# Patient Record
Sex: Male | Born: 1975
Health system: Southern US, Community
[De-identification: ages and names within clinical notes are randomized; demographics above are authoritative.]

## PROBLEM LIST (undated history)

## (undated) DIAGNOSIS — J45909 Unspecified asthma, uncomplicated: Secondary | ICD-10-CM

## (undated) DIAGNOSIS — M5126 Other intervertebral disc displacement, lumbar region: Secondary | ICD-10-CM

## (undated) HISTORY — PX: TONSILLECTOMY: SUR1361

## (undated) HISTORY — PX: NASAL SINUS SURGERY: SHX719

---

## 2015-12-22 ENCOUNTER — Ambulatory Visit
Admission: RE | Admit: 2015-12-22 | Discharge: 2015-12-22 | Disposition: A | Discharge status: Home / Self Care | Payer: BLUE CROSS/BLUE SHIELD | Source: Ambulatory Visit | Attending: Family Medicine | Admitting: Family Medicine

## 2015-12-22 ENCOUNTER — Other Ambulatory Visit: Payer: Self-pay | Admitting: Family Medicine

## 2015-12-22 DIAGNOSIS — R0789 Other chest pain: Secondary | ICD-10-CM

## 2015-12-22 MED ORDER — IOPAMIDOL (ISOVUE-370) INJECTION 76%
100.0000 mL | Freq: Once | INTRAVENOUS | Status: AC | PRN
  Administered 2015-12-22: 100 mL via INTRAVENOUS

## 2016-06-11 DIAGNOSIS — R509 Fever, unspecified: Secondary | ICD-10-CM | POA: Diagnosis not present

## 2016-06-28 DIAGNOSIS — H40023 Open angle with borderline findings, high risk, bilateral: Secondary | ICD-10-CM | POA: Diagnosis not present

## 2016-07-08 DIAGNOSIS — H401131 Primary open-angle glaucoma, bilateral, mild stage: Secondary | ICD-10-CM | POA: Diagnosis not present

## 2016-08-28 DIAGNOSIS — H401131 Primary open-angle glaucoma, bilateral, mild stage: Secondary | ICD-10-CM | POA: Diagnosis not present

## 2016-09-01 DIAGNOSIS — Z23 Encounter for immunization: Secondary | ICD-10-CM | POA: Diagnosis not present

## 2017-02-24 DIAGNOSIS — H401131 Primary open-angle glaucoma, bilateral, mild stage: Secondary | ICD-10-CM | POA: Diagnosis not present

## 2017-07-14 DIAGNOSIS — Z23 Encounter for immunization: Secondary | ICD-10-CM | POA: Diagnosis not present

## 2017-07-14 DIAGNOSIS — Z029 Encounter for administrative examinations, unspecified: Secondary | ICD-10-CM | POA: Diagnosis not present

## 2017-07-14 DIAGNOSIS — H401190 Primary open-angle glaucoma, unspecified eye, stage unspecified: Secondary | ICD-10-CM | POA: Diagnosis not present

## 2017-09-10 DIAGNOSIS — H401131 Primary open-angle glaucoma, bilateral, mild stage: Secondary | ICD-10-CM | POA: Diagnosis not present

## 2018-03-11 DIAGNOSIS — H401131 Primary open-angle glaucoma, bilateral, mild stage: Secondary | ICD-10-CM | POA: Diagnosis not present

## 2018-09-16 DIAGNOSIS — H401131 Primary open-angle glaucoma, bilateral, mild stage: Secondary | ICD-10-CM | POA: Diagnosis not present

## 2020-05-11 DIAGNOSIS — H401131 Primary open-angle glaucoma, bilateral, mild stage: Secondary | ICD-10-CM | POA: Diagnosis not present

## 2020-07-31 DIAGNOSIS — Z1159 Encounter for screening for other viral diseases: Secondary | ICD-10-CM | POA: Diagnosis not present

## 2020-07-31 DIAGNOSIS — Z0000 Encounter for general adult medical examination without abnormal findings: Secondary | ICD-10-CM | POA: Diagnosis not present

## 2020-07-31 DIAGNOSIS — R208 Other disturbances of skin sensation: Secondary | ICD-10-CM | POA: Diagnosis not present

## 2020-07-31 DIAGNOSIS — Z13220 Encounter for screening for lipoid disorders: Secondary | ICD-10-CM | POA: Diagnosis not present

## 2020-07-31 DIAGNOSIS — Z125 Encounter for screening for malignant neoplasm of prostate: Secondary | ICD-10-CM | POA: Diagnosis not present

## 2020-07-31 DIAGNOSIS — R519 Headache, unspecified: Secondary | ICD-10-CM | POA: Diagnosis not present

## 2020-08-03 DIAGNOSIS — Z1211 Encounter for screening for malignant neoplasm of colon: Secondary | ICD-10-CM | POA: Diagnosis not present

## 2020-08-31 DIAGNOSIS — R0681 Apnea, not elsewhere classified: Secondary | ICD-10-CM | POA: Diagnosis not present

## 2020-10-12 DIAGNOSIS — F17210 Nicotine dependence, cigarettes, uncomplicated: Secondary | ICD-10-CM | POA: Diagnosis not present

## 2020-10-12 DIAGNOSIS — J343 Hypertrophy of nasal turbinates: Secondary | ICD-10-CM | POA: Diagnosis not present

## 2020-10-12 DIAGNOSIS — J342 Deviated nasal septum: Secondary | ICD-10-CM | POA: Diagnosis not present

## 2020-12-21 DIAGNOSIS — Z01818 Encounter for other preprocedural examination: Secondary | ICD-10-CM | POA: Diagnosis not present

## 2020-12-25 DIAGNOSIS — J342 Deviated nasal septum: Secondary | ICD-10-CM | POA: Diagnosis not present

## 2020-12-25 DIAGNOSIS — J343 Hypertrophy of nasal turbinates: Secondary | ICD-10-CM | POA: Diagnosis not present

## 2021-02-08 DIAGNOSIS — K219 Gastro-esophageal reflux disease without esophagitis: Secondary | ICD-10-CM | POA: Diagnosis not present

## 2021-02-08 DIAGNOSIS — M5417 Radiculopathy, lumbosacral region: Secondary | ICD-10-CM | POA: Diagnosis not present

## 2021-02-15 DIAGNOSIS — M5459 Other low back pain: Secondary | ICD-10-CM | POA: Diagnosis not present

## 2021-02-16 DIAGNOSIS — M5416 Radiculopathy, lumbar region: Secondary | ICD-10-CM | POA: Diagnosis not present

## 2021-02-19 DIAGNOSIS — M5459 Other low back pain: Secondary | ICD-10-CM | POA: Diagnosis not present

## 2021-02-22 ENCOUNTER — Emergency Department (HOSPITAL_COMMUNITY)
Admission: EM | Admit: 2021-02-22 | Discharge: 2021-02-22 | Disposition: A | Discharge status: Home / Self Care | Payer: BC Managed Care – PPO | Attending: Emergency Medicine | Admitting: Emergency Medicine

## 2021-02-22 ENCOUNTER — Encounter (HOSPITAL_COMMUNITY): Payer: Self-pay | Admitting: Emergency Medicine

## 2021-02-22 ENCOUNTER — Other Ambulatory Visit: Payer: Self-pay

## 2021-02-22 DIAGNOSIS — R6889 Other general symptoms and signs: Secondary | ICD-10-CM | POA: Diagnosis not present

## 2021-02-22 DIAGNOSIS — F17200 Nicotine dependence, unspecified, uncomplicated: Secondary | ICD-10-CM | POA: Diagnosis not present

## 2021-02-22 DIAGNOSIS — M545 Low back pain, unspecified: Secondary | ICD-10-CM | POA: Insufficient documentation

## 2021-02-22 DIAGNOSIS — T7840XA Allergy, unspecified, initial encounter: Secondary | ICD-10-CM | POA: Diagnosis not present

## 2021-02-22 DIAGNOSIS — T50905A Adverse effect of unspecified drugs, medicaments and biological substances, initial encounter: Secondary | ICD-10-CM | POA: Diagnosis not present

## 2021-02-22 DIAGNOSIS — R0602 Shortness of breath: Secondary | ICD-10-CM | POA: Insufficient documentation

## 2021-02-22 DIAGNOSIS — R220 Localized swelling, mass and lump, head: Secondary | ICD-10-CM | POA: Diagnosis not present

## 2021-02-22 DIAGNOSIS — T782XXA Anaphylactic shock, unspecified, initial encounter: Secondary | ICD-10-CM | POA: Diagnosis not present

## 2021-02-22 DIAGNOSIS — M5126 Other intervertebral disc displacement, lumbar region: Secondary | ICD-10-CM | POA: Diagnosis not present

## 2021-02-22 HISTORY — DX: Other intervertebral disc displacement, lumbar region: M51.26

## 2021-02-22 MED ORDER — FAMOTIDINE IN NACL 20-0.9 MG/50ML-% IV SOLN
20.0000 mg | Freq: Once | INTRAVENOUS | Status: AC
  Administered 2021-02-22: 20 mg via INTRAVENOUS
  Filled 2021-02-22: qty 50

## 2021-02-22 MED ORDER — EPINEPHRINE 0.3 MG/0.3ML IJ SOAJ
0.3000 mg | Freq: Once | INTRAMUSCULAR | Status: AC
  Administered 2021-02-22: 0.3 mg via INTRAMUSCULAR
  Filled 2021-02-22: qty 0.3

## 2021-02-22 MED ORDER — EPINEPHRINE 0.3 MG/0.3ML IJ SOAJ: 0.3000 mg | INTRAMUSCULAR | 0 refills | Status: AC | PRN

## 2021-02-22 MED ORDER — METHYLPREDNISOLONE SODIUM SUCC 125 MG IJ SOLR
125.0000 mg | Freq: Once | INTRAMUSCULAR | Status: AC
  Administered 2021-02-22: 125 mg via INTRAVENOUS
  Filled 2021-02-22: qty 2

## 2021-02-22 MED ORDER — SODIUM CHLORIDE 0.9 % IV BOLUS
1000.0000 mL | Freq: Once | INTRAVENOUS | Status: AC
Start: 2021-02-22 — End: 2021-02-22
  Administered 2021-02-22: 1000 mL via INTRAVENOUS

## 2021-02-22 NOTE — ED Triage Notes (Signed)
Patient presents due to throat discomfort, difficulty breathing and facial swelling. Throat discomfort started yesterday. Patient takes oxycodone for pain due to herniated disc. His MD told him to go to the hospital. EMS administer 50 mg of Benadryl. EMS noted the patient scratching.    EMS vitals: 120/90 BP 97.4 Temp 98% SPO2 16 RR

## 2021-02-22 NOTE — Discharge Instructions (Signed)
Please avoid any new medication that was recently started which may contribute to your allergic reaction.  If you develop similar reaction, please use EpiPen as prescribed and return promptly.  Follow-up with an allergist for further evaluation.

## 2021-02-22 NOTE — ED Provider Notes (Signed)
[facility]-EMERGENCY DEPT Provider Note   CSN: [id number] Arrival date & time: 02/22/21  1630     History Chief Complaint  Patient presents with  . Shortness of Breath  . Back Pain  . Oral Swelling    Alexander Buckley is a 45 y.o. male.  The history is provided by the patient and the spouse. No language interpreter was used.  Shortness of Breath Back Pain    45 year old male brought here via EMS from his doctor's office for concern of allergic reaction.  Patient report he has a herniated disc in his back and he is scheduled to receive surgical intervention.  He has been given several different medication for the past few days to help with his back 1 of which is oxycodone as well as Neurontin.  He report for the past 2 days he has been having sensation that his throat is closing and feeling a bit lightheaded.  Report having trouble sleeping felt that he was wheezing. today during his visit to his PCP he was given some additional medications and shortly after he develop a skin rash, worsening shortness of breath and shakiness along with feeling flushed.  EMS was contacted and patient was given Benadryl prior to arrival.  He did report some improvement of his symptoms after receiving Benadryl.  He denies any prior allergies in the past.  He denies any other environmental changes, new pets or new soap.  He is unsure which medication may contribute to his allergic reaction.  Past Medical History:  Diagnosis Date  . Lumbar herniated disc     There are no problems to display for this patient.   History reviewed. No pertinent surgical history.     History reviewed. No pertinent family history.  Social History   Tobacco Use  . Smoking status: Current Every Day Smoker  . Smokeless tobacco: Never Used    Home Medications Prior to Admission medications   Not on File    Allergies    Patient has no allergy information on record.  Review of Systems   Review  of Systems  Respiratory: Positive for shortness of breath.   Musculoskeletal: Positive for back pain.  All other systems reviewed and are negative.   Physical Exam Updated Vital Signs BP (!) 114/103   Pulse 97   Temp 99.3 F (37.4 C) (Oral)   Resp 20   Wt 78 kg   SpO2 99%   Physical Exam Vitals and nursing note reviewed.  Constitutional:      General: He is not in acute distress.    Appearance: He is well-developed.  HENT:     Head: Atraumatic.     Mouth/Throat:     Comments: Uvula midline no tonsillar enlargement or exudates no tongue swelling no mucosal swelling no trismus and no stridor Eyes:     Conjunctiva/sclera: Conjunctivae normal.  Cardiovascular:     Rate and Rhythm: Normal rate and regular rhythm.     Pulses: Normal pulses.     Heart sounds: Normal heart sounds.  Pulmonary:     Effort: Pulmonary effort is normal.     Breath sounds: Normal breath sounds. No wheezing, rhonchi or rales.  Abdominal:     Palpations: Abdomen is soft.     Tenderness: There is no abdominal tenderness.  Musculoskeletal:     Cervical back: Neck supple.  Skin:    Findings: No rash (Urticarial rash noted throughout face neck and upper back and chest.).  Neurological:  Mental Status: He is alert and oriented to person, place, and time.  Psychiatric:        Mood and Affect: Mood normal.     ED Results / Procedures / Treatments   Labs (all labs ordered are listed, but only abnormal results are displayed) Labs Reviewed - No data to display  EKG None  Radiology No results found.  Procedures .Critical Care Performed by: Fayrene Helper, PA-C Authorized by: Fayrene Helper, PA-C   Critical care provider statement:    Critical care time (minutes):  36   Critical care was time spent personally by me on the following activities:  Discussions with consultants, evaluation of patient's response to treatment, examination of patient, ordering and performing treatments and interventions,  ordering and review of laboratory studies, ordering and review of radiographic studies, pulse oximetry, re-evaluation of patient's condition, obtaining history from patient or surrogate and review of old charts     Medications Ordered in ED Medications  EPINEPHrine (EPI-PEN) injection 0.3 mg (0.3 mg Intramuscular Given 02/22/21 1753)  methylPREDNISolone sodium succinate (SOLU-MEDROL) 125 mg/2 mL injection 125 mg (125 mg Intravenous Given 02/22/21 1752)  famotidine (PEPCID) IVPB 20 mg premix (0 mg Intravenous Stopped 02/22/21 1827)  sodium chloride 0.9 % bolus 1,000 mL (0 mLs Intravenous Stopped 02/22/21 1904)    ED Course  I have reviewed the triage vital signs and the nursing notes.  Pertinent labs & imaging results that were available during my care of the patient were reviewed by me and considered in my medical decision making (see chart for details).    MDM Rules/Calculators/A&P                          BP 107/67   Pulse 84   Temp 99.3 F (37.4 C) (Oral)   Resp 18   Wt 78 kg   SpO2 98%   Final Clinical Impression(s) / ED Diagnoses Final diagnoses:  Anaphylaxis, initial encounter    Rx / DC Orders ED Discharge Orders         Ordered    EPINEPHrine 0.3 mg/0.3 mL IJ SOAJ injection  As needed        02/22/21 2058         5:26 PM Patient with lumbar disc hernia scheduled to have back surgery who visit his doctor for preop today.He apparently developed an anaphylactic reaction to unknown medication which could be gabapentin or oxycodone that was recently given for his back pain.  He is reporting symptom concerning for anaphylaxis which include throat swelling, difficulty breathing and having wheezing.  He also has urticarial rash on exam.  He was given Benadryl prior to arrival with some reported improvement.  Will give EpiPen, Solu-Medrol, Pepcid, and IV fluid.  Will monitor closely.  8:59 PM Patient will monitor closely for the past 4 hours without any worsening of  symptoms.  He felt much better.  At this time he is stable for discharge.  Encourage patient to avoid all of the new medications which may contribute to his symptoms.  He would benefit from close follow-up with allergist for further assessment.  Patient prescribed EpiPen at discharge.  Return precaution given.   Fayrene Helper, PA-C 02/22/21 2101    Gwyneth Sprout, MD 02/26/21 [phone]

## 2021-02-27 DIAGNOSIS — E7800 Pure hypercholesterolemia, unspecified: Secondary | ICD-10-CM | POA: Diagnosis not present

## 2021-02-27 DIAGNOSIS — Z01818 Encounter for other preprocedural examination: Secondary | ICD-10-CM | POA: Diagnosis not present

## 2021-02-28 DIAGNOSIS — M5416 Radiculopathy, lumbar region: Secondary | ICD-10-CM | POA: Diagnosis not present

## 2021-02-28 DIAGNOSIS — G4700 Insomnia, unspecified: Secondary | ICD-10-CM | POA: Diagnosis not present

## 2021-03-02 ENCOUNTER — Ambulatory Visit (INDEPENDENT_AMBULATORY_CARE_PROVIDER_SITE_OTHER): Payer: BC Managed Care – PPO | Admitting: Allergy

## 2021-03-02 ENCOUNTER — Other Ambulatory Visit: Payer: Self-pay

## 2021-03-02 ENCOUNTER — Encounter: Payer: Self-pay | Admitting: Allergy

## 2021-03-02 VITALS — BP 110/80 | HR 82 | Temp 98.3°F | Resp 18 | Ht 70.0 in | Wt 164.6 lb

## 2021-03-02 DIAGNOSIS — T50905D Adverse effect of unspecified drugs, medicaments and biological substances, subsequent encounter: Secondary | ICD-10-CM

## 2021-03-02 DIAGNOSIS — T7840XD Allergy, unspecified, subsequent encounter: Secondary | ICD-10-CM

## 2021-03-02 NOTE — Patient Instructions (Addendum)
 -   will obtain tryptase level (this looks to see if your allergy cells are "hyperreactive" and potentially prone to unprovoked reactions) - skin testing to miralax performed (this is the only medication of what you were taking we have avaiable for skin testing) is negative.  Thus does not have IgE (allergy antibody) to Miralax - skin testing to shrimp is negative.  Thus not concerned for shrimp allergy.   - for the other medications you were taking- gabapentin, oxycodone, senna,ducolax- the only way to see if you are non-allergic to these medications would be to perform in-office graded oral challenge - for medications like oxycodone (opiate based medications) a common side effect can be hives, itching, flushing.  Thus it is possible that you experienced side effects related to oxycodone use - if opiate based medication is necessary in the future you could use antihistamine regimen: Zyrtec 10mg  1 tab twice a day with Pepcid 20mg  1 tab twice a day.  This high-dose antihistamine regimen may allow you to tolerate this medication if needed in future.  If you note allergic reaction like symptoms (see action plan) with opiate use then discontinue medication - Ultram is an alternative pain relieving medication recommended for use with opiate medication is not able to be taken - would recommend you have access to epinephrine device and follow emergency action plan provided today  Can schedule for oral medication challenge in-office (can start with gabapentin -- if you still have the medication can bring to the office if it is in tablet form.  If it is a capsule let know)

## 2021-03-02 NOTE — Progress Notes (Signed)
New Patient Note  RE: Alexander Buckley MRN: [id number] DOB: 1976/09/23 Date of Office Visit: 03/02/2021  Referring provider: Christena Deem, MD Primary care provider: Ileana Ladd, MD  Chief Complaint: Allergic reaction  History of present illness: Alexander Buckley is a 45 y.o. male presenting today for consultation for anaphylaxis.  He presents today with his wife.  He has a herniated disc and needs surgery to repair this that is planned for next week.  Due to the herniated disc he also is having sciatica.  He has been recommended to take a variety of different medications to help with pain control.  Due to the pain medication it seems that he became constipated thus was also recommended to take a combination of medications to help with constipation.  He was initially taking gabapentin and oxycodone and then MiraLAX, Dulcolax and senna were added to the regimen. He states he started to feel different for a couple of days prior to having more symptoms concerning for an allergic reaction.  He states he was having some "different" breathing but denies having difficulty breathing for about 2 days prior to last Thursday.  On Thursday morning he states he had a banana and coffee which he normally does and he may have had an egg sandwich.  He went to see his PCP for preop clearance for his surgery and felt like his throat was swelling up in like his breathing was not normal and like he was wheezing and he started to feel dizzy and warm and very flushed.  His wife states he did not have hives.     His PCP called EMS and he was taken to the ED.  He was provided with Benadryl with EMS and states that did start to make him feel a bit better.   He states for 2 straight nights prior to Thursday he did have shrimp but he has not had issues with eating shrimp before.  In the ED on exam he was noted to have a "uvula midline no tonsillar enlargement or exudates no tongue swelling no mucosal swelling no trismus  and no stridor", "urticarial rash noted throughout face neck and upper back and chest".  He received IM epinephrine, IV Solu-Medrol, IV Pepcid as well as IV fluids. He was discharged with a prescription for an epinephrine device.  Symptoms did improve and he was able to be discharged home.  On Friday he took miralax to help with his constipation and after taking it felt like his throat was getting tight again.  He did not take any of the other medications on Friday just the MiraLAX.   Review of systems: Review of Systems  Constitutional: Negative for chills and fever.       Flushed.  See HPI  HENT:       See HPI  Eyes: Negative.   Respiratory: Positive for shortness of breath and wheezing. Negative for cough.   Cardiovascular: Negative.   Gastrointestinal: Positive for abdominal pain and constipation. Negative for nausea and vomiting.  Musculoskeletal: Positive for back pain.  Skin: Negative for itching.       See HPI  Neurological: Positive for dizziness. Negative for loss of consciousness.    All other systems negative unless noted above in HPI  Past medical history: Past Medical History:  Diagnosis Date  . Lumbar herniated disc     Past surgical history: History reviewed. No pertinent surgical history.  Family history:  History reviewed. No pertinent family history.  Social history:  Lives in a home with his wife.  Tobacco Use  . Smoking status: Current Every Day Smoker  . Smokeless tobacco: Never Used     Medication List: Current Outpatient Medications  Medication Sig Dispense Refill  . acetaminophen (TYLENOL) 500 MG tablet Take 500-1,000 mg by mouth every 6 (six) hours as needed (pain).    Marland Kitchen EPINEPHrine 0.3 mg/0.3 mL IJ SOAJ injection Inject 0.3 mg into the muscle as needed for anaphylaxis. 1 each 0  . latanoprost (XALATAN) 0.005 % ophthalmic solution Place 1 drop into both eyes at bedtime.    . pantoprazole (PROTONIX) 40 MG tablet Take 40 mg by mouth daily as  needed (acid reflux).    Marland Kitchen zolpidem (AMBIEN) 10 MG tablet Take 10 mg by mouth at bedtime as needed.     No current facility-administered medications for this visit.    Known medication allergies: Allergies  Allergen Reactions  . Gabapentin Swelling     Physical examination: Blood pressure 110/80, pulse 82, temperature 98.3 F (36.8 C), temperature source Temporal, resp. rate 18, height 5\' 10"  (1.778 m), weight 164 lb 9.6 oz (74.7 kg), SpO2 97 %.  General: Alert, interactive, in no acute distress. HEENT: PERRLA, TMs pearly gray, turbinates minimally edematous without discharge, post-pharynx non erythematous. Neck: Supple without lymphadenopathy. Lungs: Clear to auscultation without wheezing, rhonchi or rales. {no increased work of breathing. CV: Normal S1, S2 without murmurs. Abdomen: Nondistended, nontender. Skin: Warm and dry, without lesions or rashes. Extremities:  No clubbing, cyanosis or edema. Neuro:   Grossly intact.  Diagnositics/Labs: Allergy testing: Skin prick testing to MiraLAX at 1:100, 1:10, 1:1 concentrations were negative. Skin prick testing to strep is negative. Histamine control was positive. Allergy testing results were read and interpreted by provider, documented by clinical staff.   Assessment and plan: Adverse medication effect Allergic reaction versus side effect   - will obtain tryptase level (this looks to see if your allergy cells are "hyperreactive" and potentially prone to unprovoked reactions) - skin testing to miralax performed (this is the only medication of what you were taking we have avaiable for skin testing) is negative.  Thus does not have IgE (allergy antibody) to Miralax - skin testing to shrimp is negative.  Thus not concerned for shrimp allergy.   - for the other medications you were taking- gabapentin, oxycodone, senna,ducolax- the only way to see if you are non-allergic to these medications would be to perform in-office graded oral  challenge - for medications like oxycodone (opiate based medications) a common side effect can be hives, itching, flushing.  Thus it is possible that you experienced side effects related to oxycodone use - if opiate based medication is necessary in the future you could use antihistamine regimen: Zyrtec 10mg  1 tab twice a day with Pepcid 20mg  1 tab twice a day.  This high-dose antihistamine regimen may allow you to tolerate this medication if needed in future.  If you note allergic reaction like symptoms (see action plan) with opiate use then discontinue medication - Ultram is an alternative pain relieving medication recommended for use with opiate medication is not able to be taken - would recommend you have access to epinephrine device and follow emergency action plan provided today  Can schedule for oral medication challenge in-office (can start with gabapentin -- if you still have the medication can bring to the office if it is in tablet form.  If it is a capsule let know)  I appreciate the opportunity to take part  in Trai's care. Please do not hesitate to contact me with questions.  Sincerely,   Margo Aye, MD Allergy/Immunology [facility]

## 2021-03-04 LAB — TRYPTASE: Tryptase: 5.1 ug/L (ref 2.2–13.2)

## 2021-03-05 ENCOUNTER — Ambulatory Visit: Payer: Self-pay | Admitting: Orthopedic Surgery

## 2021-03-05 NOTE — H&P (Signed)
Subjective:   Alexander Buckley is a plesant 45 yo male who has had 3 weeks of radicular left leg pain. Imaging studies demonstrate a large left sided disc herniation at L5-S1. Given the size of the disc herniation I think it is unlikely conservative care such as injection therapy would improve the patients symptoms. After discussing treatment options and alternatives to surgery the patient has elected to move forward with surger. The patient is here today for a pre-operative History and Physical. They are scheduled for left Microdiscectomy @ L5-S1 on 03-16-21 with Dr. Shon Baton at [facility]. He did have a severe allergic reaction to unknown medication (percocet, vs gabapentin vs mirialax). He is currently only taking tylenol. He has taken Hydrocodone and tramadol in the past without issue. He was on the percocet for 6 days prior to the reaction happening.   Past Medical History:  Diagnosis Date  . Lumbar herniated disc     No past surgical history on file.  Current Outpatient Medications  Medication Sig Dispense Refill Last Dose  . acetaminophen (TYLENOL) 500 MG tablet Take 500-1,000 mg by mouth every 6 (six) hours as needed (pain).     Marland Kitchen EPINEPHrine 0.3 mg/0.3 mL IJ SOAJ injection Inject 0.3 mg into the muscle as needed for anaphylaxis. 1 each 0   . latanoprost (XALATAN) 0.005 % ophthalmic solution Place 1 drop into both eyes at bedtime.     . pantoprazole (PROTONIX) 40 MG tablet Take 40 mg by mouth daily as needed (acid reflux).     Marland Kitchen zolpidem (AMBIEN) 10 MG tablet Take 10 mg by mouth at bedtime as needed.      No current facility-administered medications for this visit.   Allergies  Allergen Reactions  . Gabapentin Swelling    Social History   Tobacco Use  . Smoking status: Current Every Day Smoker  . Smokeless tobacco: Never Used  Substance Use Topics  . Alcohol use: Not on file    No family history on file.  Review of Systems Pertinent items are noted in HPI.  Objective:    Vitals:  Clinical exam: Alexander Buckley is a pleasant individual, who appears younger than their stated age. He is alert and orientated 3. No shortness of breath, chest pain.  Abdomen is soft and non-tender, negative loss of bowel and bladder control, no rebound tenderness.  Negative: skin lesions abrasions contusions  Lungs: Clear to auscultation bilaterally  Cardiac: Regular rate and rhythm no rubs gallops or murmurs.  Peripheral pulses: 2+ dorsalis pedis/posterior tibialis pulses bilaterally. Compartment soft and nontender.   Gait pattern: Altered gait pattern due to severe radicular left leg pain  Assistive devices: None  Neuro: Positive left straight leg raise test. Positive numbness and dysesthesias in the left S1 dermatome. 5/5 motor strength bilaterally in the lower extremity. Negative Babinski test, no clonus, 1+ deep tendon reflexes at the knee and Achilles.  Musculoskeletal: No significant midline low back pain with direct palpation. No SI joint pain. No hip, knee, ankle pain with isolated joint range of motion.  Lumbar x-rays taken today in the office (AP/lateral/spot lateral) were reviewed: Demonstrate transitional anatomy with a S1-2 disc space that is not completely fused. No scoliosis or spondylolisthesis. No significant collapse of the disc space. No fracture seen.  Lumbar MRI: completed on 02/16/21 was reviewed with the patient. It was completed at [facility]; I have independently reviewed the images as well as the radiology report. No disc herniation, stenosis L1-5. L5-S1: Herniated disc extends inferiorly compressing the S1  nerve root on the left side. There is a rudimentary disc at S1 to but no focal abnormality is noted. Moderately large central into the left disc herniation L5-S1 with S1 nerve compression is the predominant finding.   Assessment:   Alexander Buckley is a very pleasant 45 year old gentleman who has had a three-week history of severe progressive radicular left  leg pain is clinical exam is consistent with S1 radiculopathy with positive nerve root tension signs. Fortunately, he does not demonstrate any evidence of motor weakness ( foot drop).   Plan:    At this point time I gone over treatment options which include a lumbar microdiscectomy, injection therapy with or without formalized physical therapy. All of his and his wife's questions were addressed. Given the significant radicular pain and the loss in quality-of-life and the correlation of his symptoms with the MRI I think it is reasonable to move forward with a microdiscectomy.  Risks and benefits of decompression/discectomy: Infection, bleeding, death, stroke, paralysis, ongoing or worse pain, need for additional surgery, leak of spinal fluid, adjacent segment degeneration requiring additional surgery, post-operative hematoma formation that can result in neurological compromise and the need for urgent/emergent re-operation. Loss in bowel and bladder control. Injury to major vessels that could result in the need for urgent abdominal surgery to stop bleeding. Risk of deep venous thrombosis (DVT) and the need for additional treatment. Recurrent disc herniation resulting in the need for revision surgery, which could include fusion surgery (utilizing instrumentation such as pedicle screws and intervertebral cages).  Additional risk: If instrumentation is used there is a risk of migration, or breakage of that hardware that could require additional surgery.   The patient and his wife is expressed an understanding of the risks and benefits as well as alternatives to surgery. They also expressed a willingness to move forward with surgery. We will obtain preoperative medical clearance and move forward with surgery in a timely fashion given the severity of his radiculopathy.  We have received preoperative medical clearance from the patient's primary care provider.  I reviewed the patient's medications with him.  He is not on any blood thinners. Not using any aspirin products. Not using any anti-inflammatory products. Not taking any over-the-counter vitamins or supplements.  We have also discussed the post-operative recovery period to include: bathing/showering restrictions, wound healing, activity (and driving) restrictions, medications/pain mangement. We discussed at length the patient's recent reaction to an unknown medication. The patient is seen in a allergist who thinks it is very unlikely caused by the Percocet the patient was taking. The patient has tolerated hydrocodone in the past without any issue. Our plan will be to start the patient on hydrocodone/acetaminophen in the hospital and make sure he is tolerating it well and then likely discharge him on this medication.  We have also discussed post-operative redflags to include: signs and symptoms of postoperative infection, DVT/PE.  Patient is wearing LSO brace that was given to him at previous office visit.  Patient and his wife were given a copy of the discharge instructions which were reviewed at today's office visit. All the questions were invited and answered.  Follow-up: 2 weeks postop

## 2021-03-05 NOTE — H&P (Deleted)
  The note originally documented on this encounter has been moved the the encounter in which it belongs.  

## 2021-03-05 NOTE — Progress Notes (Signed)
Surgical Instructions    Your procedure is scheduled on Thursday 03/08/21.  Report to [facility] Main Entrance "A" at 11:00 A.M., then check in with the Admitting office.   Call this number if you have problems the morning of surgery:  [phone]    If you have any questions prior to your surgery date call [phone]: Open Monday-Friday 8am-4pm    Remember:  Do not eat after midnight the night before your surgery  You may drink clear liquids until 10:00 the morning of your surgery.   Clear liquids allowed are: Water, Non-Citrus Juices (without pulp), Carbonated Beverages, Clear Tea, Black Coffee Only, and Gatorade    Take these medicines the morning of surgery IF NEEDED with A SIP OF WATER: Acetaminophen (Tylenol) Pantoprazole (Protonix)  As of today, STOP taking any Aspirin or aspirin-containing products (unless otherwise instructed by your surgeon), Aleve, Naproxen, Ibuprofen, Motrin, Advil, Goody's, BC's, all herbal medications, supplements, fish oil, and all vitamins.          Do NOT Smoke (Tobacco/Vaping) or drink Alcohol 24 hours prior to your procedure  If you use a CPAP at night, you may bring all equipment for your overnight stay.   Contacts, glasses, hearing aids, dentures or partials may not be worn into surgery, please bring cases for these belongings   For patients admitted to the hospital, discharge time will be determined by your treatment team.   Patients discharged the day of surgery will not be allowed to drive home, and someone needs to stay with them for 24 hours.    Special instructions:   - Preparing For Surgery  Before surgery, you can play an important role. Because skin is not sterile, your skin needs to be as free of germs as possible. You can reduce the number of germs on your skin by washing with CHG (chlorahexidine gluconate) Soap before surgery.  CHG is an antiseptic cleaner which kills germs and bonds with the skin to continue killing  germs even after washing.    Oral Hygiene is also important to reduce your risk of infection.  Remember - BRUSH YOUR TEETH THE MORNING OF SURGERY WITH YOUR REGULAR TOOTHPASTE  Please do not use if you have an allergy to CHG or antibacterial soaps. If your skin becomes reddened/irritated stop using the CHG.  Do not shave (including legs and underarms) for at least 48 hours prior to first CHG shower. It is OK to shave your face.  Please follow these instructions carefully.   You are going to shower with CHG soap 2 different times.  The NIGHT BEFORE SURGERY/PROCEDURE and then again the MORNING OF SURGERY/PROCEDURE   1. If you chose to wash your hair, wash your hair first as usual with your normal shampoo.  2. After you shampoo, rinse your hair and body thoroughly to remove the shampoo.  3. Wash Face and genitals (private parts) with your normal soap.   4. THEN Shower with CHG Soap.   5. Use CHG as you would any other liquid soap. You can apply CHG directly to the skin and wash gently with a pouf/sponge or a clean washcloth.   6. Apply the CHG Soap to your body ONLY FROM THE NECK DOWN.  Do not use on open wounds or open sores. Avoid contact with your eyes, ears, mouth and genitals (private parts). Wash Face and genitals (private parts)  with your normal soap.   7. Wash thoroughly, paying special attention to the area where your surgery will  be performed.  8. Thoroughly rinse your body with warm water from the neck down.  9. DO NOT shower/wash with your normal soap after using and rinsing off the CHG Soap.  10. Pat yourself dry with a CLEAN TOWEL.  11. Wear CLEAN PAJAMAS to bed the night before surgery  12. Place CLEAN SHEETS on your bed the night before your surgery  13. DO NOT SLEEP WITH PETS.   Day of Surgery: Shower with CHG soap as directed.  Men may shave face and neck.  Do not wear lotions, powders, perfumes/colognes, or deodorant.  Wear Clean/Comfortable clothing the  morning of surgery  Remember to brush your teeth WITH YOUR REGULAR TOOTHPASTE.             Do not wear jewelry, make up, or nail polish  Do not bring valuables to the hospital.             [facility] is not responsible for any belongings or valuables.    Please read over the following fact sheets that you were given.

## 2021-03-06 ENCOUNTER — Encounter (HOSPITAL_COMMUNITY)
Admission: RE | Admit: 2021-03-06 | Discharge: 2021-03-06 | Disposition: A | Discharge status: Home / Self Care | Payer: BC Managed Care – PPO | Source: Ambulatory Visit | Attending: Orthopedic Surgery | Admitting: Orthopedic Surgery

## 2021-03-06 ENCOUNTER — Ambulatory Visit (HOSPITAL_COMMUNITY)
Admission: RE | Admit: 2021-03-06 | Discharge: 2021-03-06 | Disposition: A | Discharge status: Home / Self Care | Payer: BC Managed Care – PPO | Source: Ambulatory Visit | Attending: Orthopedic Surgery | Admitting: Orthopedic Surgery

## 2021-03-06 ENCOUNTER — Other Ambulatory Visit: Payer: Self-pay

## 2021-03-06 ENCOUNTER — Encounter (HOSPITAL_COMMUNITY): Payer: Self-pay

## 2021-03-06 DIAGNOSIS — Z20822 Contact with and (suspected) exposure to covid-19: Secondary | ICD-10-CM | POA: Diagnosis not present

## 2021-03-06 DIAGNOSIS — Z01818 Encounter for other preprocedural examination: Secondary | ICD-10-CM

## 2021-03-06 DIAGNOSIS — J45909 Unspecified asthma, uncomplicated: Secondary | ICD-10-CM | POA: Diagnosis not present

## 2021-03-06 HISTORY — DX: Unspecified asthma, uncomplicated: J45.909

## 2021-03-06 LAB — URINALYSIS, ROUTINE W REFLEX MICROSCOPIC
Bilirubin Urine: NEGATIVE
Glucose, UA: NEGATIVE mg/dL
Hgb urine dipstick: NEGATIVE
Ketones, ur: NEGATIVE mg/dL
Leukocytes,Ua: NEGATIVE
Nitrite: NEGATIVE
Protein, ur: NEGATIVE mg/dL
Specific Gravity, Urine: 1.019 (ref 1.005–1.030)
pH: 5 (ref 5.0–8.0)

## 2021-03-06 LAB — SURGICAL PCR SCREEN
MRSA, PCR: NEGATIVE
Staphylococcus aureus: NEGATIVE

## 2021-03-06 LAB — SARS CORONAVIRUS 2 (TAT 6-24 HRS): SARS Coronavirus 2: NEGATIVE

## 2021-03-06 LAB — APTT: aPTT: 33 seconds (ref 24–36)

## 2021-03-06 LAB — PROTIME-INR
INR: 1 (ref 0.8–1.2)
Prothrombin Time: 13 seconds (ref 11.4–15.2)

## 2021-03-06 NOTE — Progress Notes (Signed)
PCP:  Leodis Sias, MD Cardiologist: denies  EKG:02/27/21.  Patient brought EKG tracing - placed on chart CXR: 03/06/21 ECHO: denies Stress Test: denies Cardiac Cath: denies  Fasting Blood Sugar- na Checks Blood Sugar_na__ times a day  OSA/CPAP: No  ASA/Blood Thinners: No  Covid test 03/06/21 at PAT  Anesthesia Review: No.  Paper copy of labs 02/27/21 brought by patient and placed on chart.   Patient denies shortness of breath, fever, cough, and chest pain at PAT appointment.  Patient verbalized understanding of instructions provided today at the PAT appointment.  Patient asked to review instructions at home and day of surgery.

## 2021-03-08 ENCOUNTER — Ambulatory Visit (HOSPITAL_COMMUNITY): Payer: BC Managed Care – PPO | Admitting: Anesthesiology

## 2021-03-08 ENCOUNTER — Encounter (HOSPITAL_COMMUNITY)
Admission: RE | Disposition: A | Discharge status: Home / Self Care | Payer: Self-pay | Source: Home / Self Care | Attending: Orthopedic Surgery

## 2021-03-08 ENCOUNTER — Ambulatory Visit (HOSPITAL_COMMUNITY): Payer: BC Managed Care – PPO

## 2021-03-08 ENCOUNTER — Ambulatory Visit (HOSPITAL_COMMUNITY)
Admission: RE | Admit: 2021-03-08 | Discharge: 2021-03-08 | Disposition: A | Discharge status: Home / Self Care | Payer: BC Managed Care – PPO | Attending: Orthopedic Surgery | Admitting: Orthopedic Surgery

## 2021-03-08 ENCOUNTER — Encounter (HOSPITAL_COMMUNITY): Payer: Self-pay | Admitting: Orthopedic Surgery

## 2021-03-08 ENCOUNTER — Other Ambulatory Visit: Payer: Self-pay

## 2021-03-08 DIAGNOSIS — F17200 Nicotine dependence, unspecified, uncomplicated: Secondary | ICD-10-CM | POA: Diagnosis not present

## 2021-03-08 DIAGNOSIS — Z886 Allergy status to analgesic agent status: Secondary | ICD-10-CM | POA: Diagnosis not present

## 2021-03-08 DIAGNOSIS — Z419 Encounter for procedure for purposes other than remedying health state, unspecified: Secondary | ICD-10-CM

## 2021-03-08 DIAGNOSIS — M5117 Intervertebral disc disorders with radiculopathy, lumbosacral region: Secondary | ICD-10-CM | POA: Diagnosis not present

## 2021-03-08 DIAGNOSIS — Z79899 Other long term (current) drug therapy: Secondary | ICD-10-CM | POA: Insufficient documentation

## 2021-03-08 DIAGNOSIS — K219 Gastro-esophageal reflux disease without esophagitis: Secondary | ICD-10-CM | POA: Diagnosis not present

## 2021-03-08 DIAGNOSIS — Z889 Allergy status to unspecified drugs, medicaments and biological substances status: Secondary | ICD-10-CM | POA: Diagnosis not present

## 2021-03-08 DIAGNOSIS — M5116 Intervertebral disc disorders with radiculopathy, lumbar region: Secondary | ICD-10-CM | POA: Diagnosis not present

## 2021-03-08 DIAGNOSIS — Z981 Arthrodesis status: Secondary | ICD-10-CM | POA: Diagnosis not present

## 2021-03-08 HISTORY — PX: LUMBAR LAMINECTOMY/DECOMPRESSION MICRODISCECTOMY: SHX5026

## 2021-03-08 LAB — BASIC METABOLIC PANEL
Anion gap: 7 (ref 5–15)
BUN: 15 mg/dL (ref 6–20)
CO2: 25 mmol/L (ref 22–32)
Calcium: 9.1 mg/dL (ref 8.9–10.3)
Chloride: 106 mmol/L (ref 98–111)
Creatinine, Ser: 0.92 mg/dL (ref 0.61–1.24)
GFR, Estimated: >60 mL/min (ref >60)
Glucose, Bld: 87 mg/dL (ref 70–99)
Potassium: 3.9 mmol/L (ref 3.5–5.1)
Sodium: 138 mmol/L (ref 135–145)

## 2021-03-08 LAB — CBC
HCT: 46.1 % (ref 39.0–52.0)
Hemoglobin: 15.4 g/dL (ref 13.0–17.0)
MCH: 30.7 pg (ref 26.0–34.0)
MCHC: 33.4 g/dL (ref 30.0–36.0)
MCV: 92 fL (ref 80.0–100.0)
Platelets: 246 10*3/uL (ref 150–400)
RBC: 5.01 MIL/uL (ref 4.22–5.81)
RDW: 13.1 % (ref 11.5–15.5)
WBC: 9.3 10*3/uL (ref 4.0–10.5)
nRBC: 0 % (ref 0.0–0.2)

## 2021-03-08 SURGERY — LUMBAR LAMINECTOMY/DECOMPRESSION MICRODISCECTOMY 1 LEVEL
Anesthesia: General | Site: Spine Lumbar | Laterality: Left

## 2021-03-08 MED ORDER — ONDANSETRON HCL 4 MG/2ML IJ SOLN: 4.0000 mg | Freq: Once | INTRAMUSCULAR | Status: DC | PRN

## 2021-03-08 MED ORDER — ROCURONIUM BROMIDE 10 MG/ML (PF) SYRINGE
PREFILLED_SYRINGE | INTRAVENOUS | Status: DC | PRN
  Administered 2021-03-08: 10 mg via INTRAVENOUS
  Administered 2021-03-08: 60 mg via INTRAVENOUS

## 2021-03-08 MED ORDER — METHOCARBAMOL 500 MG PO TABS: 500.0000 mg | ORAL_TABLET | Freq: Three times a day (TID) | ORAL | 0 refills | Status: AC | PRN

## 2021-03-08 MED ORDER — TRANEXAMIC ACID-NACL 1000-0.7 MG/100ML-% IV SOLN
INTRAVENOUS | Status: AC
  Filled 2021-03-08: qty 100

## 2021-03-08 MED ORDER — ONDANSETRON HCL 4 MG/2ML IJ SOLN
INTRAMUSCULAR | Status: DC | PRN
  Administered 2021-03-08: 4 mg via INTRAVENOUS

## 2021-03-08 MED ORDER — CHLORHEXIDINE GLUCONATE 0.12 % MT SOLN
15.0000 mL | Freq: Once | OROMUCOSAL | Status: AC
  Administered 2021-03-08: 15 mL via OROMUCOSAL
  Filled 2021-03-08: qty 15

## 2021-03-08 MED ORDER — MIDAZOLAM HCL 2 MG/2ML IJ SOLN
INTRAMUSCULAR | Status: DC | PRN
  Administered 2021-03-08: 2 mg via INTRAVENOUS

## 2021-03-08 MED ORDER — BUPIVACAINE LIPOSOME 1.3 % IJ SUSP
INTRAMUSCULAR | Status: AC
  Filled 2021-03-08: qty 20

## 2021-03-08 MED ORDER — HYDROCODONE-ACETAMINOPHEN 10-325 MG PO TABS: 1.0000 | ORAL_TABLET | Freq: Four times a day (QID) | ORAL | 0 refills | Status: AC | PRN

## 2021-03-08 MED ORDER — METHYLPREDNISOLONE ACETATE 40 MG/ML IJ SUSP
INTRAMUSCULAR | Status: AC
  Filled 2021-03-08: qty 1

## 2021-03-08 MED ORDER — 0.9 % SODIUM CHLORIDE (POUR BTL) OPTIME
TOPICAL | Status: DC | PRN
  Administered 2021-03-08: 1000 mL

## 2021-03-08 MED ORDER — ONDANSETRON HCL 4 MG/2ML IJ SOLN
INTRAMUSCULAR | Status: AC
  Filled 2021-03-08: qty 2

## 2021-03-08 MED ORDER — HEMOSTATIC AGENTS (NO CHARGE) OPTIME
TOPICAL | Status: DC | PRN
  Administered 2021-03-08 (×2): 1 via TOPICAL

## 2021-03-08 MED ORDER — BUPIVACAINE LIPOSOME 1.3 % IJ SUSP
INTRAMUSCULAR | Status: DC | PRN
  Administered 2021-03-08: 20 mL

## 2021-03-08 MED ORDER — LIDOCAINE 2% (20 MG/ML) 5 ML SYRINGE
INTRAMUSCULAR | Status: DC | PRN
  Administered 2021-03-08: 60 mg via INTRAVENOUS

## 2021-03-08 MED ORDER — ORAL CARE MOUTH RINSE: 15.0000 mL | Freq: Once | OROMUCOSAL | Status: AC

## 2021-03-08 MED ORDER — PROPOFOL 10 MG/ML IV BOLUS
INTRAVENOUS | Status: AC
  Filled 2021-03-08: qty 20

## 2021-03-08 MED ORDER — DEXAMETHASONE SODIUM PHOSPHATE 10 MG/ML IJ SOLN
INTRAMUSCULAR | Status: AC
  Filled 2021-03-08: qty 1

## 2021-03-08 MED ORDER — ROCURONIUM BROMIDE 10 MG/ML (PF) SYRINGE
PREFILLED_SYRINGE | INTRAVENOUS | Status: AC
  Filled 2021-03-08: qty 10

## 2021-03-08 MED ORDER — PROPOFOL 10 MG/ML IV BOLUS
INTRAVENOUS | Status: DC | PRN
  Administered 2021-03-08: 200 mg via INTRAVENOUS

## 2021-03-08 MED ORDER — FENTANYL CITRATE (PF) 100 MCG/2ML IJ SOLN
25.0000 ug | INTRAMUSCULAR | Status: DC | PRN
  Administered 2021-03-08: 50 ug via INTRAVENOUS

## 2021-03-08 MED ORDER — TRANEXAMIC ACID-NACL 1000-0.7 MG/100ML-% IV SOLN
INTRAVENOUS | Status: DC | PRN
  Administered 2021-03-08: 1000 mg via INTRAVENOUS

## 2021-03-08 MED ORDER — METHYLPREDNISOLONE ACETATE 40 MG/ML INJ SUSP (RADIOLOG
INTRAMUSCULAR | Status: DC | PRN
  Administered 2021-03-08: 40 mg

## 2021-03-08 MED ORDER — MIDAZOLAM HCL 2 MG/2ML IJ SOLN
INTRAMUSCULAR | Status: AC
  Filled 2021-03-08: qty 2

## 2021-03-08 MED ORDER — LIDOCAINE 2% (20 MG/ML) 5 ML SYRINGE
INTRAMUSCULAR | Status: AC
  Filled 2021-03-08: qty 5

## 2021-03-08 MED ORDER — AMISULPRIDE (ANTIEMETIC) 5 MG/2ML IV SOLN: 10.0000 mg | Freq: Once | INTRAVENOUS | Status: DC | PRN

## 2021-03-08 MED ORDER — CEFAZOLIN SODIUM-DEXTROSE 2-4 GM/100ML-% IV SOLN
2.0000 g | INTRAVENOUS | Status: AC
  Administered 2021-03-08: 2 g via INTRAVENOUS
  Filled 2021-03-08: qty 100

## 2021-03-08 MED ORDER — FENTANYL CITRATE (PF) 100 MCG/2ML IJ SOLN
INTRAMUSCULAR | Status: AC
  Filled 2021-03-08: qty 2

## 2021-03-08 MED ORDER — ONDANSETRON HCL 4 MG PO TABS: 4.0000 mg | ORAL_TABLET | Freq: Three times a day (TID) | ORAL | 0 refills | Status: AC | PRN

## 2021-03-08 MED ORDER — DEXAMETHASONE SODIUM PHOSPHATE 10 MG/ML IJ SOLN
INTRAMUSCULAR | Status: DC | PRN
  Administered 2021-03-08: 10 mg via INTRAVENOUS

## 2021-03-08 MED ORDER — LACTATED RINGERS IV SOLN: INTRAVENOUS | Status: DC

## 2021-03-08 MED ORDER — THROMBIN 20000 UNITS EX SOLR
CUTANEOUS | Status: AC
  Filled 2021-03-08: qty 20000

## 2021-03-08 MED ORDER — SUGAMMADEX SODIUM 200 MG/2ML IV SOLN
INTRAVENOUS | Status: DC | PRN
  Administered 2021-03-08: 300 mg via INTRAVENOUS

## 2021-03-08 MED ORDER — BUPIVACAINE-EPINEPHRINE (PF) 0.25% -1:200000 IJ SOLN
INTRAMUSCULAR | Status: AC
  Filled 2021-03-08: qty 30

## 2021-03-08 MED ORDER — BUPIVACAINE-EPINEPHRINE 0.25% -1:200000 IJ SOLN
INTRAMUSCULAR | Status: DC | PRN
  Administered 2021-03-08: 20 mL
  Administered 2021-03-08: 5 mL

## 2021-03-08 MED ORDER — OXYCODONE HCL 5 MG PO TABS: 5.0000 mg | ORAL_TABLET | Freq: Once | ORAL | Status: DC | PRN

## 2021-03-08 MED ORDER — OXYCODONE HCL 5 MG/5ML PO SOLN: 5.0000 mg | Freq: Once | ORAL | Status: DC | PRN

## 2021-03-08 MED ORDER — FENTANYL CITRATE (PF) 250 MCG/5ML IJ SOLN
INTRAMUSCULAR | Status: AC
  Filled 2021-03-08: qty 5

## 2021-03-08 MED ORDER — THROMBIN 20000 UNITS EX SOLR
CUTANEOUS | Status: DC | PRN
  Administered 2021-03-08: 20 mL via TOPICAL

## 2021-03-08 MED ORDER — FENTANYL CITRATE (PF) 250 MCG/5ML IJ SOLN
INTRAMUSCULAR | Status: DC | PRN
  Administered 2021-03-08 (×3): 50 ug via INTRAVENOUS
  Administered 2021-03-08: 100 ug via INTRAVENOUS

## 2021-03-08 SURGICAL SUPPLY — 58 items
BNDG GAUZE ELAST 4 BULKY (GAUZE/BANDAGES/DRESSINGS) ×4 IMPLANT
BUR EGG ELITE 4.0 (BURR) IMPLANT
BUR MATCHSTICK NEURO 3.0 LAGG (BURR) IMPLANT
CANISTER SUCT 3000ML PPV (MISCELLANEOUS) ×2 IMPLANT
CLSR STERI-STRIP ANTIMIC 1/2X4 (GAUZE/BANDAGES/DRESSINGS) ×2 IMPLANT
CORD BIPOLAR FORCEPS 12FT (ELECTRODE) ×2 IMPLANT
COVER SURGICAL LIGHT HANDLE (MISCELLANEOUS) ×2 IMPLANT
COVER WAND RF STERILE (DRAPES) IMPLANT
DRAIN CHANNEL 15F RND FF W/TCR (WOUND CARE) IMPLANT
DRAPE POUCH INSTRU U-SHP 10X18 (DRAPES) ×2 IMPLANT
DRAPE SURG 17X23 STRL (DRAPES) ×2 IMPLANT
DRAPE U-SHAPE 47X51 STRL (DRAPES) ×2 IMPLANT
DRSG OPSITE POSTOP 3X4 (GAUZE/BANDAGES/DRESSINGS) ×2 IMPLANT
DURAPREP 26ML APPLICATOR (WOUND CARE) ×2 IMPLANT
ELECT BLADE 4.0 EZ CLEAN MEGAD (MISCELLANEOUS) ×2
ELECT CAUTERY BLADE 6.4 (BLADE) ×2 IMPLANT
ELECT PENCIL ROCKER SW 15FT (MISCELLANEOUS) ×2 IMPLANT
ELECT REM PT RETURN 9FT ADLT (ELECTROSURGICAL) ×2
ELECTRODE BLDE 4.0 EZ CLN MEGD (MISCELLANEOUS) ×1 IMPLANT
ELECTRODE REM PT RTRN 9FT ADLT (ELECTROSURGICAL) ×1 IMPLANT
EVACUATOR SILICONE 100CC (DRAIN) IMPLANT
FILTER STRAW FLUID ASPIR (MISCELLANEOUS) ×2 IMPLANT
GLOVE BIO SURGEON STRL SZ 6.5 (GLOVE) ×2 IMPLANT
GLOVE BIOGEL PI IND STRL 8.5 (GLOVE) ×1 IMPLANT
GLOVE BIOGEL PI INDICATOR 8.5 (GLOVE) ×1
GLOVE SS BIOGEL STRL SZ 8.5 (GLOVE) ×1 IMPLANT
GLOVE SUPERSENSE BIOGEL SZ 8.5 (GLOVE) ×1
GLOVE SURG UNDER POLY LF SZ6.5 (GLOVE) ×2 IMPLANT
GOWN STRL REUS W/ TWL LRG LVL3 (GOWN DISPOSABLE) ×2 IMPLANT
GOWN STRL REUS W/TWL 2XL LVL3 (GOWN DISPOSABLE) ×2 IMPLANT
GOWN STRL REUS W/TWL LRG LVL3 (GOWN DISPOSABLE) ×2
KIT BASIN OR (CUSTOM PROCEDURE TRAY) ×2 IMPLANT
KIT TURNOVER KIT B (KITS) ×2 IMPLANT
NEEDLE 22X1 1/2 (OR ONLY) (NEEDLE) ×2 IMPLANT
NEEDLE SPNL 18GX3.5 QUINCKE PK (NEEDLE) ×4 IMPLANT
NS IRRIG 1000ML POUR BTL (IV SOLUTION) ×2 IMPLANT
PACK LAMINECTOMY ORTHO (CUSTOM PROCEDURE TRAY) ×2 IMPLANT
PACK UNIVERSAL I (CUSTOM PROCEDURE TRAY) ×2 IMPLANT
PAD ARMBOARD 7.5X6 YLW CONV (MISCELLANEOUS) ×4 IMPLANT
PATTIES SURGICAL .5 X.5 (GAUZE/BANDAGES/DRESSINGS) ×2 IMPLANT
PATTIES SURGICAL .5 X1 (DISPOSABLE) IMPLANT
SPONGE SURGIFOAM ABS GEL 100 (HEMOSTASIS) ×2 IMPLANT
STRIP CLOSURE SKIN 1/2X4 (GAUZE/BANDAGES/DRESSINGS) ×2 IMPLANT
SURGIFLO W/THROMBIN 8M KIT (HEMOSTASIS) ×4 IMPLANT
SUT BONE WAX W31G (SUTURE) ×2 IMPLANT
SUT MNCRL AB 3-0 PS2 27 (SUTURE) ×2 IMPLANT
SUT VIC AB 0 CT1 27 (SUTURE)
SUT VIC AB 0 CT1 27XBRD ANBCTR (SUTURE) IMPLANT
SUT VIC AB 1 CT1 18XCR BRD 8 (SUTURE) ×1 IMPLANT
SUT VIC AB 1 CT1 8-18 (SUTURE) ×1
SUT VIC AB 2-0 CT1 18 (SUTURE) ×2 IMPLANT
SYR BULB IRRIG 60ML STRL (SYRINGE) ×2 IMPLANT
SYR CONTROL 10ML LL (SYRINGE) ×2 IMPLANT
SYR TB 1ML LUER SLIP (SYRINGE) ×2 IMPLANT
TOWEL GREEN STERILE (TOWEL DISPOSABLE) ×2 IMPLANT
TOWEL GREEN STERILE FF (TOWEL DISPOSABLE) ×2 IMPLANT
WATER STERILE IRR 1000ML POUR (IV SOLUTION) ×2 IMPLANT
YANKAUER SUCT BULB TIP NO VENT (SUCTIONS) ×2 IMPLANT

## 2021-03-08 NOTE — Anesthesia Procedure Notes (Signed)
Procedure Name: Intubation Performed by: Shary Decamp, CRNA Pre-anesthesia Checklist: Patient identified, Patient being monitored, Timeout performed, Emergency Drugs available and Suction available Patient Re-evaluated:Patient Re-evaluated prior to induction Oxygen Delivery Method: Circle system utilized Preoxygenation: Pre-oxygenation with 100% oxygen Induction Type: IV induction Ventilation: Mask ventilation without difficulty Laryngoscope Size: Miller and 2 Grade View: Grade I Tube type: Oral Tube size: 7.5 mm Number of attempts: 1 Airway Equipment and Method: Stylet Placement Confirmation: ETT inserted through vocal cords under direct vision,  positive ETCO2 and breath sounds checked- equal and bilateral Secured at: 24 cm Tube secured with: Tape Dental Injury: Teeth and Oropharynx as per pre-operative assessment

## 2021-03-08 NOTE — Brief Op Note (Signed)
03/08/2021  1:56 PM  PATIENT:  Alexander Buckley  45 y.o. male  PRE-OPERATIVE DIAGNOSIS:  Left L5-S1 herniated disc with radiculopathy  POST-OPERATIVE DIAGNOSIS:  Left L5-S1 herniated disc with radiculopathy  PROCEDURE:  Procedure(s) with comments: Left Lumbar five-Sacral one discectomy (Left) - 2.5 hrs  SURGEON:  Surgeon(s) and Role:    Venita Lick, MD - Primary  PHYSICIAN ASSISTANT:  Marchelle Folks [place], PA   ANESTHESIA:   general  EBL:  15 CC  BLOOD ADMINISTERED:none  DRAINS: none   LOCAL MEDICATIONS USED:  MARCAINE     SPECIMEN:  No Specimen  DISPOSITION OF SPECIMEN:  N/A  COUNTS:  YES  TOURNIQUET:  * No tourniquets in log *  DICTATION: .Dragon Dictation  PLAN OF CARE: Discharge to home after PACU  PATIENT DISPOSITION:  PACU - hemodynamically stable.

## 2021-03-08 NOTE — Transfer of Care (Signed)
Immediate Anesthesia Transfer of Care Note  Patient: Alexander Buckley  Procedure(s) Performed: Left Lumbar five-Sacral one discectomy (Left Spine Lumbar)  Patient Location: PACU  Anesthesia Type:General  Level of Consciousness: drowsy and patient cooperative  Airway & Oxygen Therapy: Patient Spontanous Breathing and Patient connected to face mask oxygen  Post-op Assessment: Report given to RN and Post -op Vital signs reviewed and stable  Post vital signs: Reviewed and stable  Last Vitals:  Vitals Value Taken Time  BP 126/72 03/08/21 1431  Temp    Pulse 105 03/08/21 1432  Resp 19 03/08/21 1432  SpO2 100 % 03/08/21 1432  Vitals shown include unvalidated device data.  Last Pain:  Vitals:   03/08/21 1017  TempSrc:   PainSc: 1          Complications: No complications documented.

## 2021-03-08 NOTE — H&P (Signed)
Addendum H&P: Patient continues to have severe back buttock and radicular leg pain.  Imaging studies demonstrate the disc herniation at L5-S1 causing nerve compression.  As result of the severity of his pain and neurological deficits we have elected to move forward with an L5-S1 microdiscectomy.  All appropriate risks benefits and alternatives to surgery were discussed with the patient and consent was obtained.  There has been no change in his clinical exam since his last office visit of 03/05/2021

## 2021-03-08 NOTE — Discharge Instructions (Signed)

## 2021-03-08 NOTE — Anesthesia Postprocedure Evaluation (Signed)
Anesthesia Post Note  Patient: Alexander Buckley  Procedure(s) Performed: Left Lumbar five-Sacral one discectomy (Left Spine Lumbar)     Patient location during evaluation: PACU Anesthesia Type: General Level of consciousness: awake and alert Pain management: pain level controlled Vital Signs Assessment: post-procedure vital signs reviewed and stable Respiratory status: spontaneous breathing, nonlabored ventilation and respiratory function stable Cardiovascular status: blood pressure returned to baseline and stable Postop Assessment: no apparent nausea or vomiting Anesthetic complications: no   No complications documented.  Last Vitals:  Vitals:   03/08/21 1430 03/08/21 1446  BP: 126/72 121/76  Pulse: (!) 102 95  Resp: 15 14  Temp: 36.6 C   SpO2: 99% 96%    Last Pain:  Vitals:   03/08/21 1430  TempSrc:   PainSc: Asleep                 Lucretia Kern

## 2021-03-08 NOTE — Op Note (Signed)
OPERATIVE REPORT  DATE OF SURGERY: 03/08/2021  PATIENT NAME:  Alexander Buckley MRN: [id number] DOB: [birth year]  PCP: Ileana Ladd, MD  PRE-OPERATIVE DIAGNOSIS: Left L5-S1 disc herniation with L5 radiculopathy POST-OPERATIVE DIAGNOSIS: Same  PROCEDURE:   Left L5-S1 microdiscectomy. CPT code 57846  SURGEON:  Venita Lick, MD  PHYSICIAN ASSISTANT: Marchelle Folks, [place]  ANESTHESIA:   General  EBL: 15 ml   Complications: None  BRIEF HISTORY: Alexander Buckley is a 45 y.o. male who presents with significant back and radicular leg pain.  Patient imaging studies confirm transitional anatomy with L5-S1 disc herniation posterior lateral to the left.  There was significant compression of the S1 nerve root, and his clinical exam was consistent with his MRI findings.  As result of the severe radicular pain we elected to move forward with surgery.  All appropriate risks, benefits, alternatives to surgery were discussed with the patient and his wife at the agreed to move forward with surgery.  PROCEDURE DETAILS: Patient was brought into the operating room. After successful induction of general anesthesia and endotracheal intubation a Time Out was done. This confirmed all pertinent important data.  Teds SCDs were applied and he was turned prone onto the Wilson frame.  All bony prominences well-padded and the back was prepped and draped in standard fashion.  Needles were placed into the back and an intraoperative x-ray was done for localization of the incision I marked out the L5-S1 disc space and infiltrated the skin with Marcaine with epinephrine.  Midline incision was made and sharp dissection was carried out down to the deep fascia.  To aid in postoperative analgesia I infiltrated the paraspinal muscles at this point with the remainder of the quarter percent Marcaine with epinephrine and Exparel.  The deep fascia was then sharply incised and I exposed the L5-S1 spinous process and lamina.  Penfield 4 was placed  into the L5 lamina and an x-ray was taken.  Once I confirmed I was at the appropriate level I then performed a laminotomy of L5 with a 3 mm Kerrison rongeur.  The bleeding bone edges were secured with bone wax.  Penfield 4 was used to dissect through the ligamentum flavum and a 2 and 3 mm Kerrison rongeur was used to remove the ligamentum flavum.  I then dissected into the lateral recess and performed a medial facetectomy.  The S1 nerve root was now clearly visible.  I gently dissected inferiorly along the course of the nerve root until I found the fragment of disc material.  I gently mobilized the nerve root using a Penfield 4 creating an annulotomy.  I then used my nerve hook to mobilize 2 large fragments of disc material that were located just behind the S1 vertebral body causing compression of the S1 nerve root.  I remove the fragments of disc material and in total it was quite consistent with what was seen on the preoperative MRI.  I then proceeded cephalad towards the actual disc space.  I was able to find the loose fragments of disc material that was still within the disc space itself and remove them with a micropituitary rondure.  I then identified the large epidural veins and coagulated them with bipolar electrocautery.  At this point time I could freely pass my nerve hook along the path of the S1 nerve root and into the foramen confirming adequate decompression.  The nerve root itself was completely mobile and no longer under tension.  I could also pass the nerve root along the  posterior aspect of the S1 vertebral body along the posterior aspect of the annulus centrally and superiorly into the lateral recess confirming satisfactory decompression.  With the nerve root decompressed I irrigated the wound copiously normal saline and made sure that hemostasis using bipolar cautery, Surgi-Flo, as well as IV TXA.  After final irrigation I placed 40 mg (1 cc) of Depo-Medrol over the nerve root to aid in  postoperative analgesia.  Thrombin-soaked Gelfoam patty was then placed over the laminotomy site and I removed my retractor.  The deep fascia was closed with interrupted #1 Vicryl suture, then a layer of 2-0 interrupted Vicryl suture, and a 3-0 Monocryl for the skin.  Steri-Strips and dry dressings were applied and patient was ultimately extubated transfer the PACU without incident.  End of the case all needle sponge counts were correct.  Venita Lick, MD 03/08/2021 1:46 PM

## 2021-03-08 NOTE — Anesthesia Preprocedure Evaluation (Signed)
Anesthesia Evaluation  Patient identified by MRN, date of birth, ID band Patient awake    Reviewed: Allergy & Precautions, NPO status , Patient's Chart, lab work & pertinent test results  History of Anesthesia Complications Negative for: history of anesthetic complications  Airway Mallampati: II  TM Distance: >3 FB Neck ROM: Full    Dental  (+) Teeth Intact   Pulmonary asthma , Current Smoker,    Pulmonary exam normal        Cardiovascular negative cardio ROS Normal cardiovascular exam     Neuro/Psych negative neurological ROS     GI/Hepatic Neg liver ROS, GERD  ,  Endo/Other  negative endocrine ROS  Renal/GU negative Renal ROS  negative genitourinary   Musculoskeletal negative musculoskeletal ROS (+)   Abdominal   Peds  Hematology negative hematology ROS (+)   Anesthesia Other Findings   Reproductive/Obstetrics                            Anesthesia Physical Anesthesia Plan  ASA: II  Anesthesia Plan: General   Post-op Pain Management:    Induction: Intravenous  PONV Risk Score and Plan: 2 and Ondansetron, Dexamethasone, Treatment may vary due to age or medical condition and Midazolam  Airway Management Planned: Oral ETT  Additional Equipment: None  Intra-op Plan:   Post-operative Plan: Extubation in OR  Informed Consent: I have reviewed the patients History and Physical, chart, labs and discussed the procedure including the risks, benefits and alternatives for the proposed anesthesia with the patient or authorized representative who has indicated his/her understanding and acceptance.     Dental advisory given  Plan Discussed with:   Anesthesia Plan Comments:         Anesthesia Quick Evaluation

## 2021-03-08 NOTE — Progress Notes (Signed)
Pt ambulated from bed in bay 4 in the pacu to the bathroom, voided, and pain was noted to be bearable for patient. Stated it's mostly a dull incisional pain. Patient was dressed and back brace was put on prior to DC.

## 2021-03-09 ENCOUNTER — Encounter (HOSPITAL_COMMUNITY): Payer: Self-pay | Admitting: Orthopedic Surgery

## 2021-04-19 DIAGNOSIS — M545 Low back pain, unspecified: Secondary | ICD-10-CM | POA: Diagnosis not present

## 2021-05-02 DIAGNOSIS — M545 Low back pain, unspecified: Secondary | ICD-10-CM | POA: Diagnosis not present

## 2021-05-29 DIAGNOSIS — M545 Low back pain, unspecified: Secondary | ICD-10-CM | POA: Diagnosis not present

## 2021-07-02 DIAGNOSIS — B369 Superficial mycosis, unspecified: Secondary | ICD-10-CM | POA: Diagnosis not present

## 2022-01-13 IMAGING — CR DG CHEST 2V
2 series · 2 of 2 positions shown · non-contrast
Comparison: 01/22/2016

CLINICAL DATA: Preoperative evaluation for back surgery, history
asthma

EXAM:
CHEST - 2 VIEW

[w chest pa]
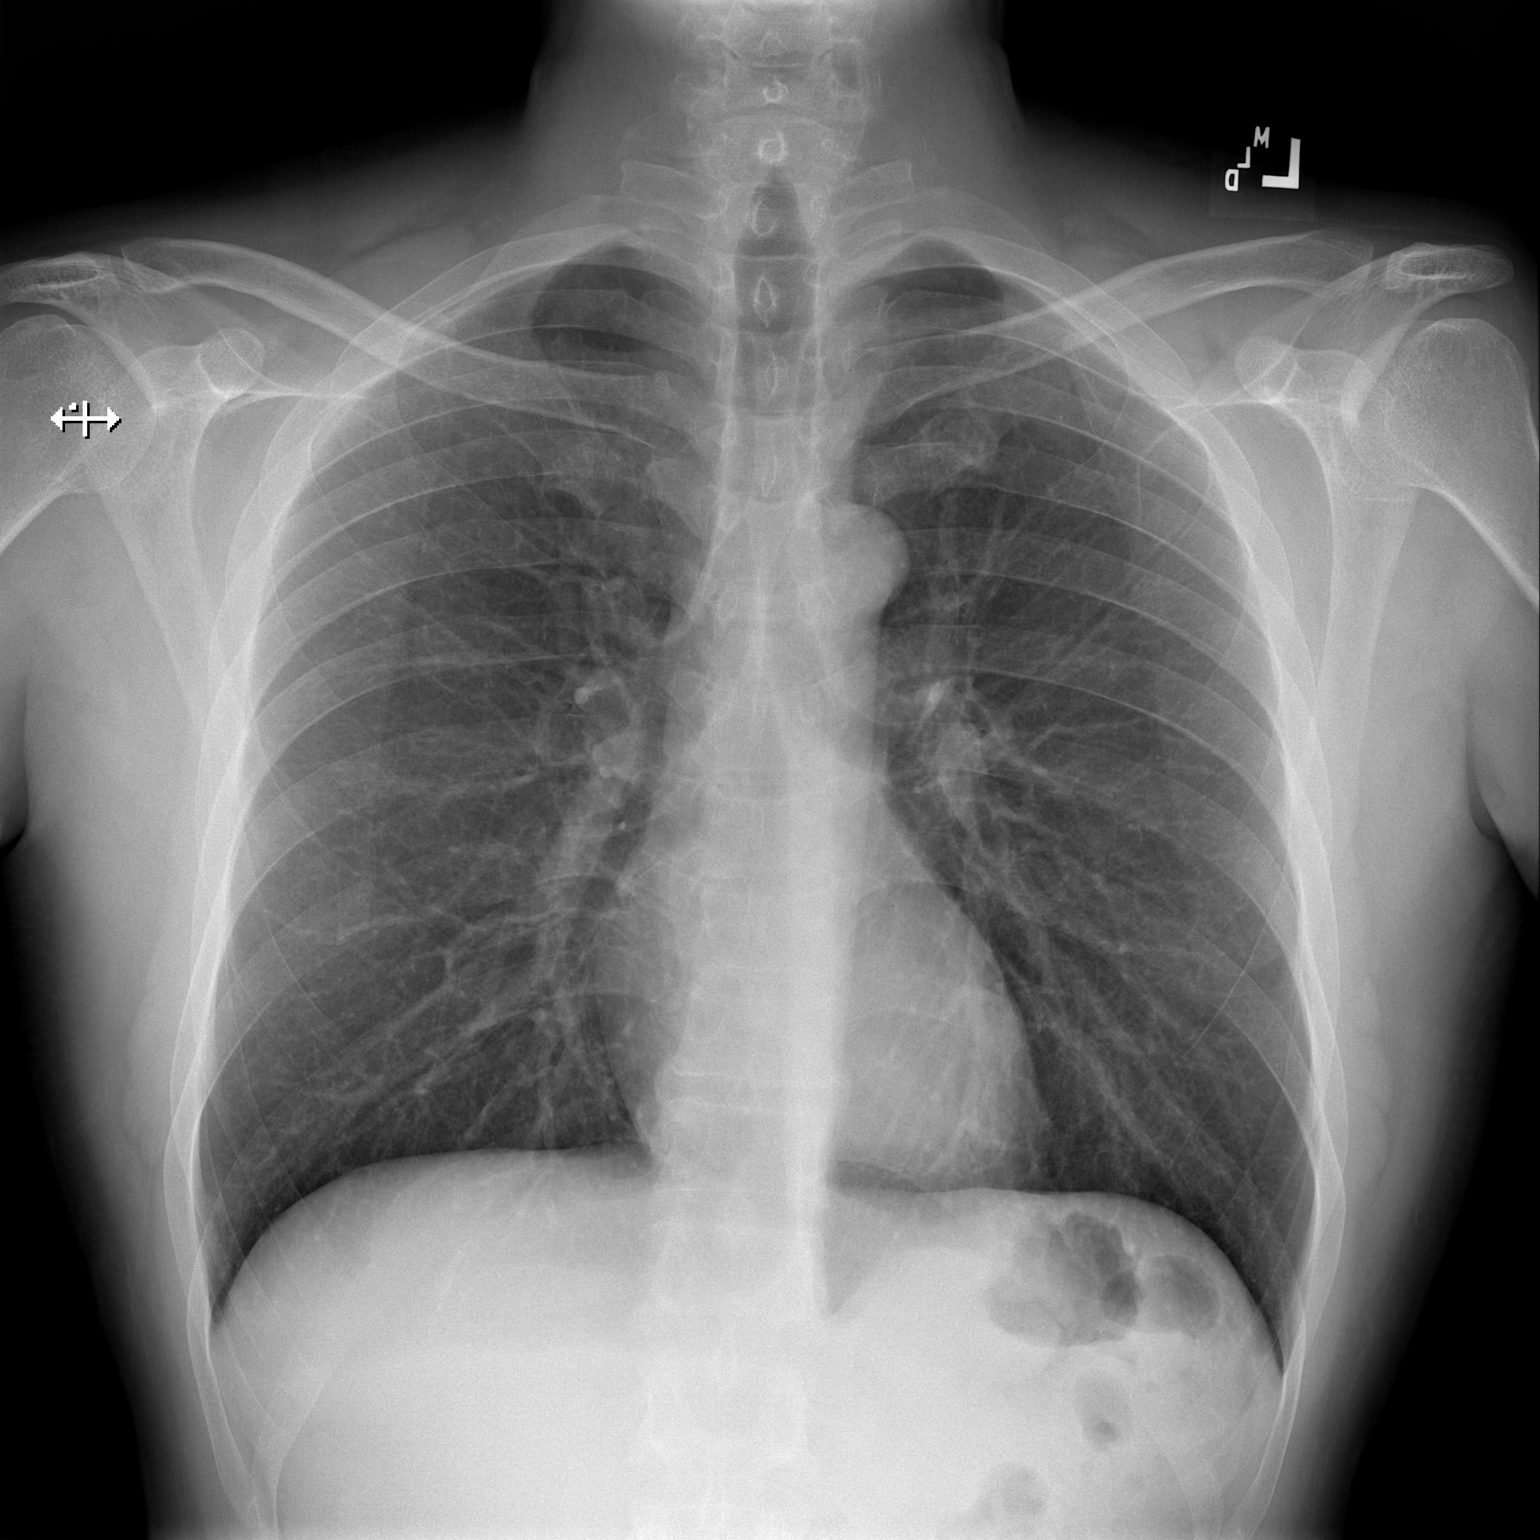

[w chest lat]
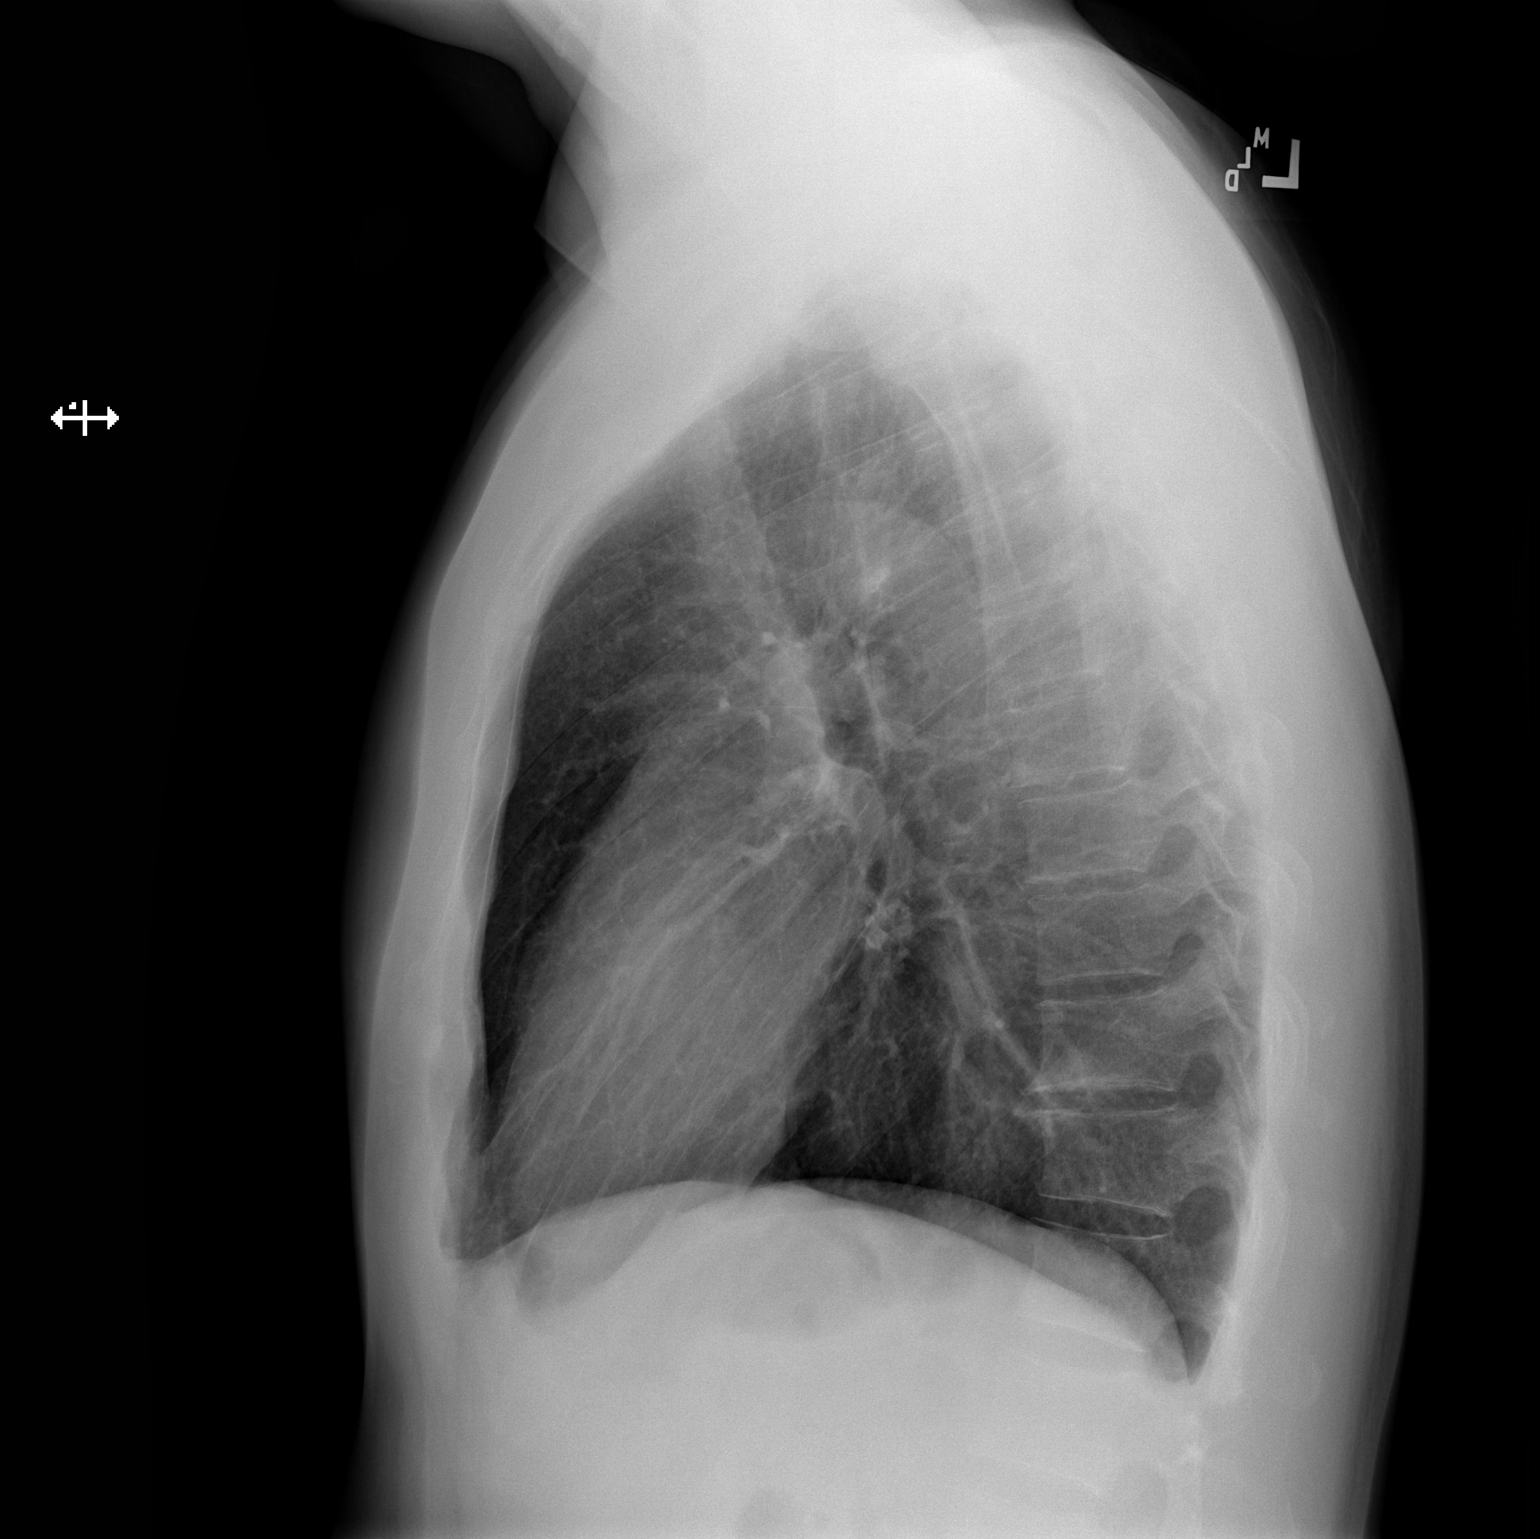

[2 of 2 positions shown; findings below may reference images not displayed]

FINDINGS: Normal heart size, mediastinal contours, and pulmonary vascularity.

Lungs clear.

No pleural effusion or pneumothorax.

Bones unremarkable.
IMPRESSION: Normal exam.

## 2022-01-15 IMAGING — CR DG LUMBAR SPINE 2-3V
2 series · 2 of 2 positions shown · non-contrast
Comparison: Lumbar spine 04/10/2021.
COMPARISON: Lumbar spine 04/10/2021.

Addendum:
CLINICAL DATA: Lumbar spine surgery.

EXAM:
LUMBAR SPINE - 2-3 VIEW

[lateral (1 of 2)]
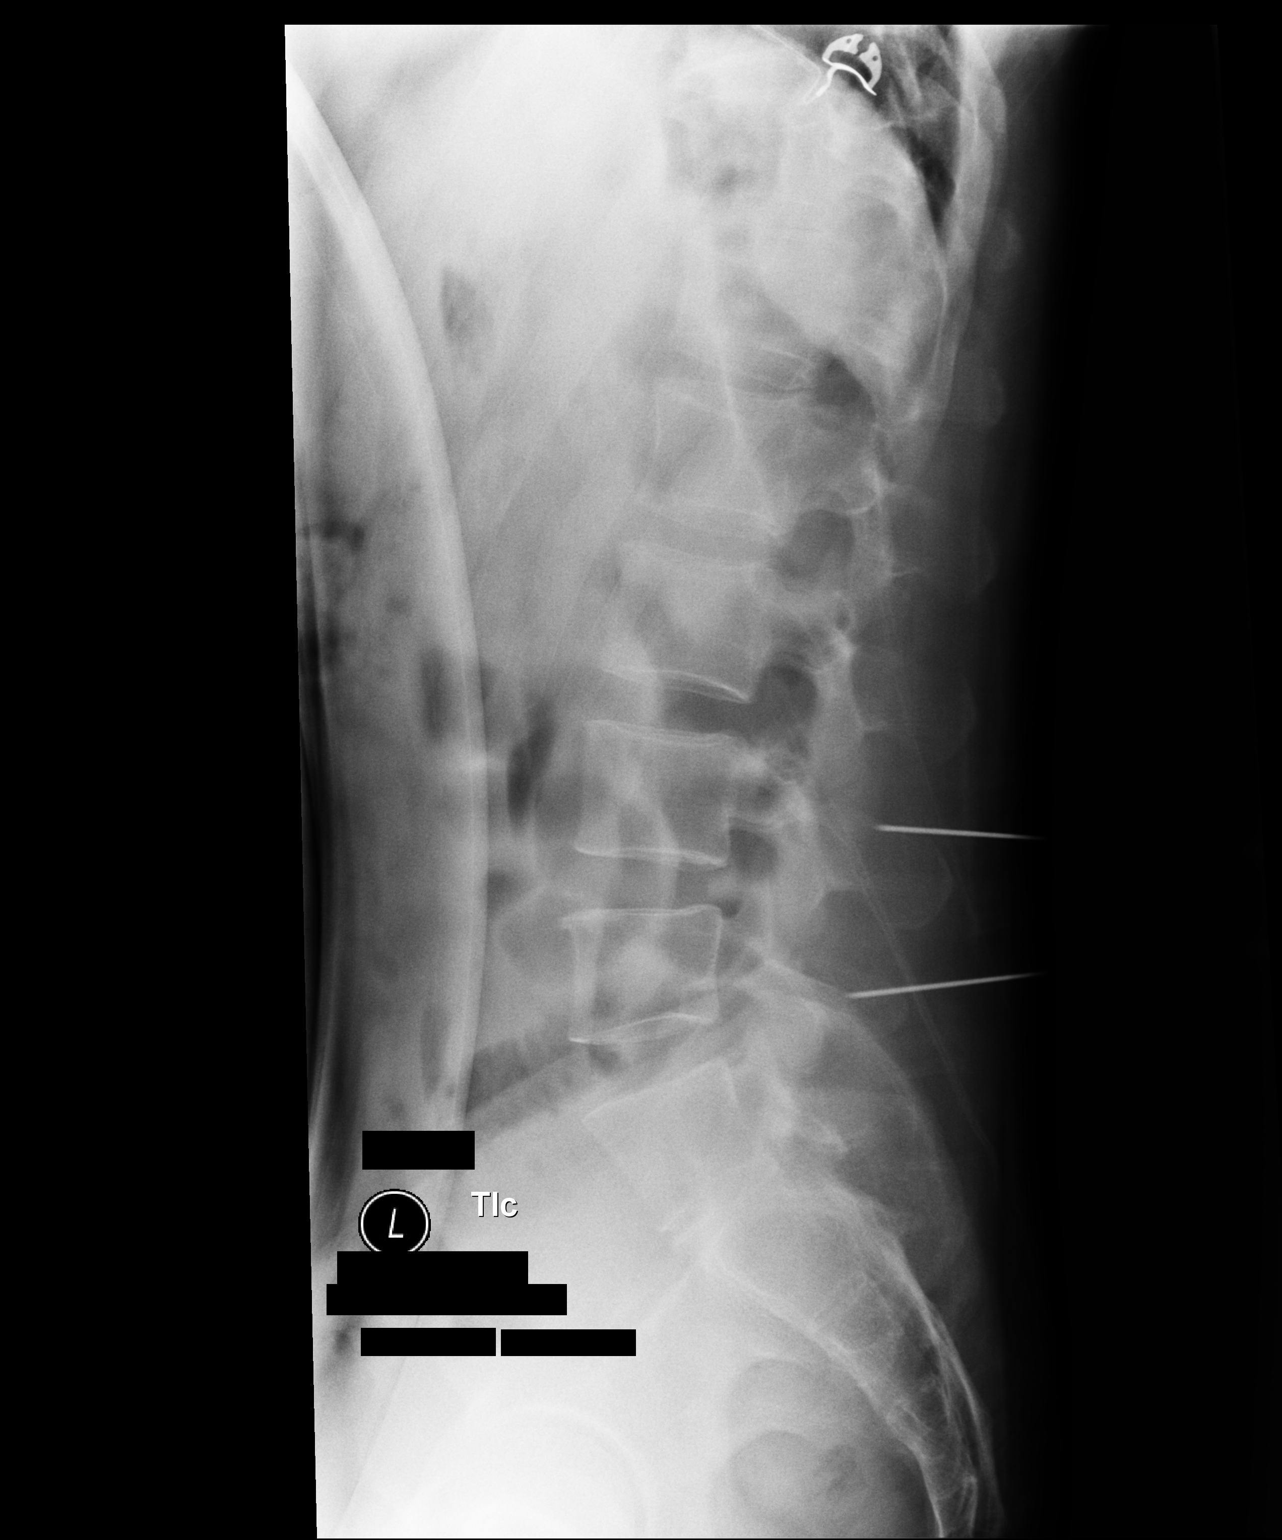

[lateral (2 of 2)]
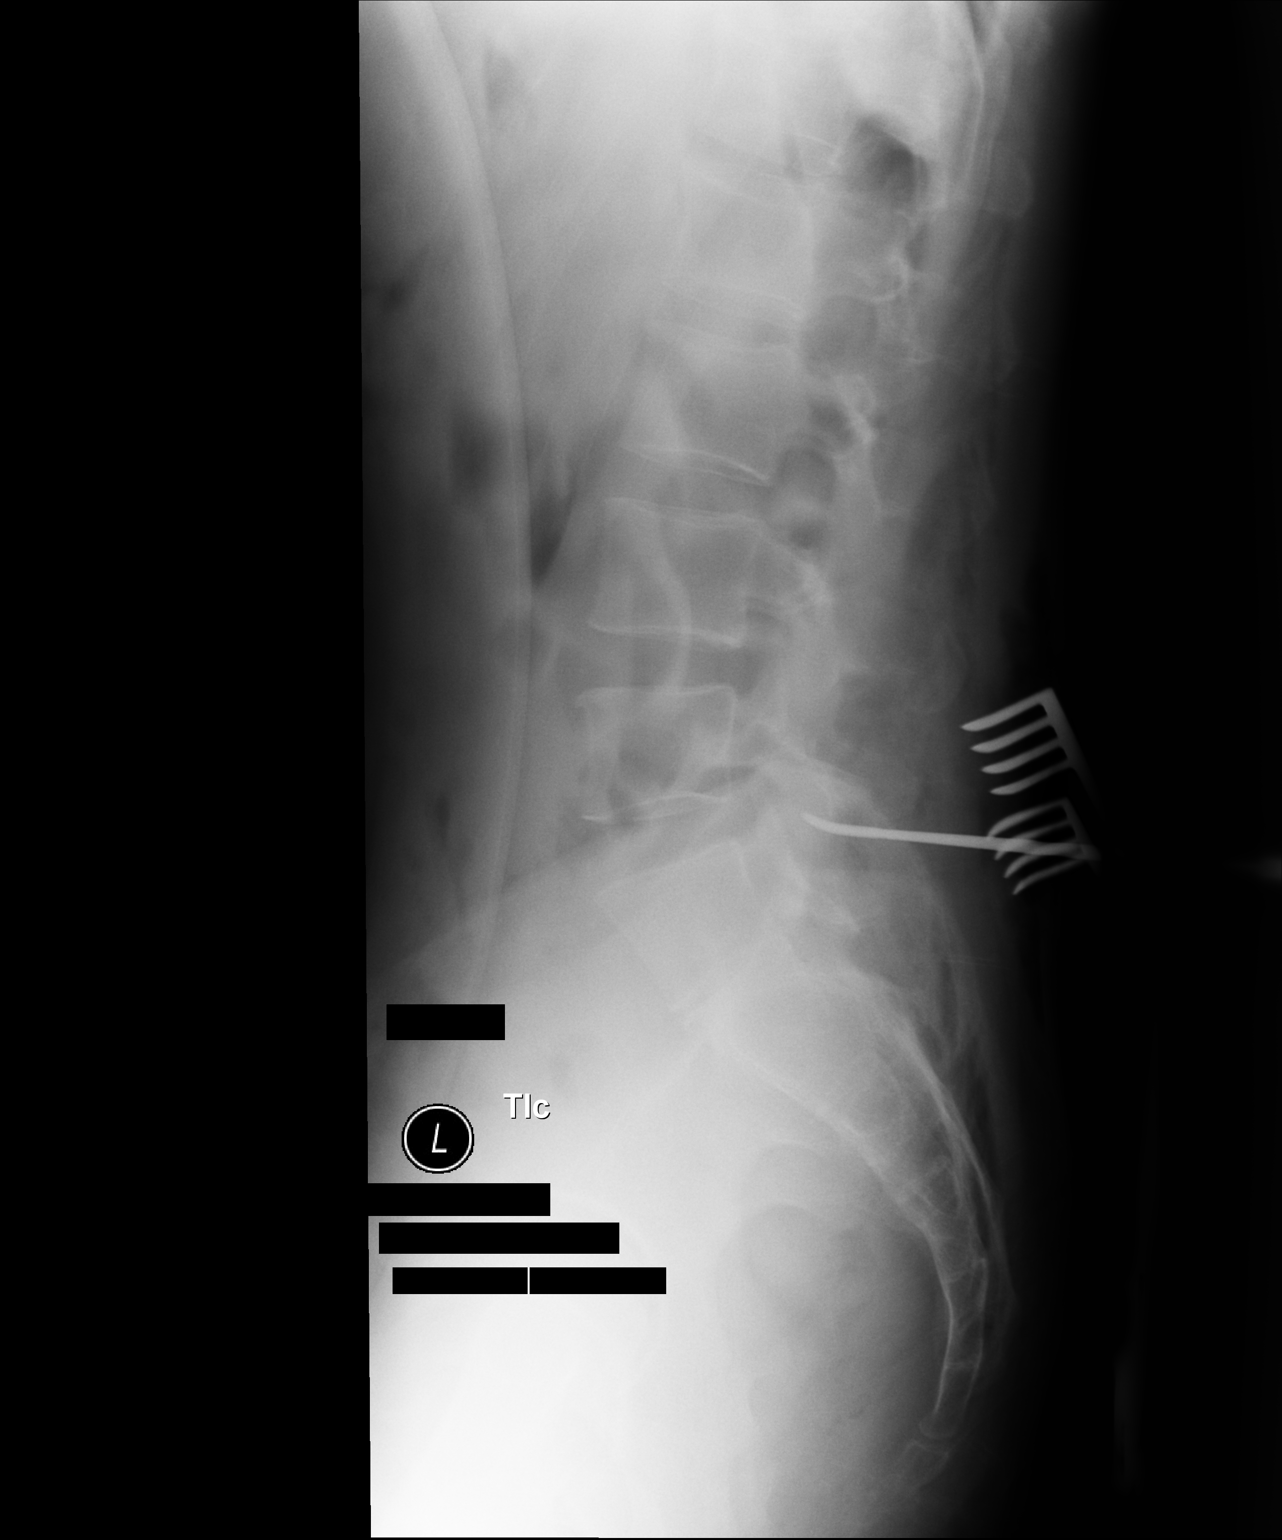

[2 of 2 positions shown; findings below may reference images not displayed]

FINDINGS: Lumbar spine numbered with the lowest completely segmented lumbar
shaped vertebrae on lateral view as L5 and the lowest full size disc
space on lateral view as L5-S1. On image number 2 metallic marker
noted posteriorly at L5-S1.
IMPRESSION: Negative.

ADDENDUM:
Transitional anatomy is most likely present. These findings phoned
to the OR at the time of the study.

*** End of Addendum ***
FINDINGS: Lumbar spine numbered with the lowest completely segmented lumbar
shaped vertebrae on lateral view as L5 and the lowest full size disc
space on lateral view as L5-S1. On image number 2 metallic marker
noted posteriorly at L5-S1.
IMPRESSION: Negative.

## 2022-02-13 DIAGNOSIS — H401131 Primary open-angle glaucoma, bilateral, mild stage: Secondary | ICD-10-CM | POA: Diagnosis not present

## 2022-10-23 DIAGNOSIS — L739 Follicular disorder, unspecified: Secondary | ICD-10-CM | POA: Diagnosis not present

## 2022-10-23 DIAGNOSIS — F17200 Nicotine dependence, unspecified, uncomplicated: Secondary | ICD-10-CM | POA: Diagnosis not present

## 2022-10-23 DIAGNOSIS — Z23 Encounter for immunization: Secondary | ICD-10-CM | POA: Diagnosis not present

## 2022-10-23 DIAGNOSIS — E7800 Pure hypercholesterolemia, unspecified: Secondary | ICD-10-CM | POA: Diagnosis not present

## 2022-10-23 DIAGNOSIS — Z0000 Encounter for general adult medical examination without abnormal findings: Secondary | ICD-10-CM | POA: Diagnosis not present

## 2023-02-05 DIAGNOSIS — E7800 Pure hypercholesterolemia, unspecified: Secondary | ICD-10-CM | POA: Diagnosis not present

## 2023-02-12 DIAGNOSIS — H401131 Primary open-angle glaucoma, bilateral, mild stage: Secondary | ICD-10-CM | POA: Diagnosis not present

## 2023-06-02 DIAGNOSIS — D120 Benign neoplasm of cecum: Secondary | ICD-10-CM | POA: Diagnosis not present

## 2023-06-02 DIAGNOSIS — Z1211 Encounter for screening for malignant neoplasm of colon: Secondary | ICD-10-CM | POA: Diagnosis not present

## 2023-06-02 DIAGNOSIS — K648 Other hemorrhoids: Secondary | ICD-10-CM | POA: Diagnosis not present

## 2023-06-02 DIAGNOSIS — K5730 Diverticulosis of large intestine without perforation or abscess without bleeding: Secondary | ICD-10-CM | POA: Diagnosis not present

## 2023-10-21 DIAGNOSIS — S39012A Strain of muscle, fascia and tendon of lower back, initial encounter: Secondary | ICD-10-CM | POA: Diagnosis not present

## 2023-10-21 DIAGNOSIS — M545 Low back pain, unspecified: Secondary | ICD-10-CM | POA: Diagnosis not present

## 2023-10-21 DIAGNOSIS — M47816 Spondylosis without myelopathy or radiculopathy, lumbar region: Secondary | ICD-10-CM | POA: Diagnosis not present

## 2023-10-27 DIAGNOSIS — M545 Low back pain, unspecified: Secondary | ICD-10-CM | POA: Diagnosis not present
